# Patient Record
Sex: Female | Born: 1970 | Race: White | Hispanic: No
Health system: Southern US, Community
[De-identification: ages and names within clinical notes are randomized; demographics above are authoritative.]

---

## 1999-11-19 ENCOUNTER — Emergency Department (HOSPITAL_COMMUNITY): Admission: EM | Admit: 1999-11-19 | Discharge: 1999-11-19 | Payer: Self-pay | Admitting: *Deleted

## 2000-07-21 ENCOUNTER — Encounter: Payer: Self-pay | Admitting: Emergency Medicine

## 2000-07-21 ENCOUNTER — Emergency Department (HOSPITAL_COMMUNITY): Admission: EM | Admit: 2000-07-21 | Discharge: 2000-07-21 | Payer: Self-pay | Admitting: Emergency Medicine

## 2002-07-30 ENCOUNTER — Ambulatory Visit (HOSPITAL_BASED_OUTPATIENT_CLINIC_OR_DEPARTMENT_OTHER): Admission: RE | Admit: 2002-07-30 | Discharge: 2002-07-31 | Payer: Self-pay | Admitting: Otolaryngology

## 2004-01-23 ENCOUNTER — Ambulatory Visit (HOSPITAL_BASED_OUTPATIENT_CLINIC_OR_DEPARTMENT_OTHER): Admission: RE | Admit: 2004-01-23 | Discharge: 2004-01-23 | Payer: Self-pay | Admitting: Otolaryngology

## 2010-07-31 ENCOUNTER — Ambulatory Visit (HOSPITAL_COMMUNITY): Admission: RE | Admit: 2010-07-31 | Discharge: 2010-08-01 | Payer: Self-pay | Admitting: Neurosurgery

## 2010-12-04 LAB — URINE MICROSCOPIC-ADD ON

## 2010-12-04 LAB — BASIC METABOLIC PANEL
BUN: 10 mg/dL (ref 6–23)
CO2: 25 mEq/L (ref 19–32)
Calcium: 9.3 mg/dL (ref 8.4–10.5)
Chloride: 111 mEq/L (ref 96–112)
Creatinine, Ser: 0.7 mg/dL (ref 0.4–1.2)
GFR calc Af Amer: >60 mL/min (ref >60)
GFR calc non Af Amer: >60 mL/min (ref >60)
Glucose, Bld: 87 mg/dL (ref 70–99)
Potassium: 4.8 mEq/L (ref 3.5–5.1)
Sodium: 140 mEq/L (ref 135–145)

## 2010-12-04 LAB — URINALYSIS, ROUTINE W REFLEX MICROSCOPIC
Bilirubin Urine: NEGATIVE
Glucose, UA: NEGATIVE mg/dL
Hgb urine dipstick: NEGATIVE
Ketones, ur: NEGATIVE mg/dL
Nitrite: NEGATIVE
Protein, ur: NEGATIVE mg/dL
Specific Gravity, Urine: 1.02 (ref 1.005–1.030)
Urobilinogen, UA: 1 mg/dL (ref 0.0–1.0)
pH: 7.5 (ref 5.0–8.0)

## 2010-12-04 LAB — PROTIME-INR
INR: 1 (ref 0.00–1.49)
INR: 1.06 (ref 0.00–1.49)
Prothrombin Time: 13.4 seconds (ref 11.6–15.2)
Prothrombin Time: 14 seconds (ref 11.6–15.2)

## 2010-12-04 LAB — CBC
HCT: 37.9 % (ref 36.0–46.0)
Hemoglobin: 12.7 g/dL (ref 12.0–15.0)
MCH: 31.6 pg (ref 26.0–34.0)
MCHC: 33.5 g/dL (ref 30.0–36.0)
MCV: 94.3 fL (ref 78.0–100.0)
Platelets: 279 10*3/uL (ref 150–400)
RBC: 4.02 MIL/uL (ref 3.87–5.11)
RDW: 12.1 % (ref 11.5–15.5)
WBC: 8.5 10*3/uL (ref 4.0–10.5)

## 2010-12-04 LAB — APTT: aPTT: 27 s (ref 24–37)

## 2010-12-04 LAB — SURGICAL PCR SCREEN
MRSA, PCR: NEGATIVE
Staphylococcus aureus: NEGATIVE

## 2011-02-08 NOTE — Op Note (Signed)
NAME:  Monica Oneal, Monica Oneal                          ACCOUNT NO.:  [id number]   MEDICAL RECORD NO.:  [id number]                   PATIENT TYPE:  AMB   LOCATION:  DSC                                  FACILITY:  [facility]   PHYSICIAN:  Jefry H. Pollyann Kennedy, M.D.                DATE OF BIRTH:  1970/10/23   DATE OF PROCEDURE:  01/23/2004  DATE OF DISCHARGE:                                 OPERATIVE REPORT   PREOPERATIVE DIAGNOSIS:  Conductive hearing loss with ossicular  discontinuity.   POSTOPERATIVE DIAGNOSIS:  Conductive hearing loss with ossicular  discontinuity.   PROCEDURE:  Left ossiculoplasty with TORP.   SURGEON:  Jefry H. Pollyann Kennedy, M.D.   ANESTHESIA:  General endotracheal.   COMPLICATIONS:  None.   FINDINGS:  Modified Lippy bucket-handle prosthesis in place over the long  process of the incus with what appears to be further erosion of the long  process.  The malleus and the incus head were in good, normal position with  normal mobility. The graft was still in place in the oval window without any  evidence of leak.   INDICATIONS FOR PROCEDURE:  This is a 40 year old lady who underwent  stapedectomy about 12 or 13 years ago. A couple of years ago, she started  having problems and underwent a successful revision stapedectomy where, at  the time, it was found that the lenticular process of the incus was eroded  away from the previous prosthesis. She did well for about 1-1/2 years, then  started having distorted sound and discomfort, and this was very troublesome  to her.  She has requested removal of the prosthesis with whatever it takes  to make sure this does not happen again.   We discussed preoperatively that we could try to do a TORP and bypass the  incus all together, which should help with the distortion problem, but may  not give her as good a hearing results.  She understood all of this and  agreed to surgery.   DESCRIPTION OF PROCEDURE:  The patient was taken to the operating  room,  placed in the  supine position on the operating table.  Following induction  of general endotracheal anesthesia, the left ear was prepped and draped in a  standard fashion. Then 1% Xylocaine with epinephrine was infiltrated into 4  quadrants of the external auditory canal.  A posterior tympanomeatal flap  was developed using a Rosen round knife and was brought forward. The middle  ear was entered.  The flap was brought forward all the way to the lateral  process of the malleus.   The old prosthesis was identified and was easily removed off of the incus  long process.  It would not pull out of the soft tissue graft in the oval  window without removing the graft, and a new graft was placed into the oval  window immediately.  The old prosthesis and  graft was removed.  The  remainder of the incus long process was removed partially with malleus  nippers to improve visualization and to allow additional space for a new  prosthesis.   A Goldenberg TORP 8 mm with a notched head was trimmed back 1 mm to help  with fitting and was inserted into position.  The shaft was well-seated  within the graft, and the head and notch were stabilized by the malleus and  the drum. There was a good, round window reflex with movement of the  malleus. The anterior tympanic cavity was  packed with saline soaked Gelfoam.  The flap was brought back to its normal  position and the ear canal along the incision was packed with Ciprodex-  soaked Gelfoam.  A cotton ball was placed in the meatus.  The patient was  then awakened, extubated and transferred to recovery in stable condition.                                               Jefry H. Pollyann Kennedy, M.D.    JHR/MEDQ  D:  01/23/2004  T:  01/23/2004  Job:  [id number]

## 2011-02-08 NOTE — Op Note (Signed)
NAME:  Monica Oneal, CUEN                          ACCOUNT NO.:  [id number]   MEDICAL RECORD NO.:  [id number]                   PATIENT TYPE:  AMB   LOCATION:  DSC                                  FACILITY:  [facility]   PHYSICIAN:  Jefry H. Pollyann Kennedy, M.D.                DATE OF BIRTH:  [birth year]   DATE OF PROCEDURE:  07/30/2002  DATE OF DISCHARGE:                                 OPERATIVE REPORT   PREOPERATIVE DIAGNOSIS:  Conductive hearing loss.   POSTOPERATIVE DIAGNOSIS:  Conductive hearing loss.   OPERATION/PROCEDURE:  Exploratory tympanotomy, AS, with revision  stapedectomy.   SURGEON:  Jefry H. Pollyann Kennedy, M.D.   ANESTHESIA:  General endotracheal anesthesia.   COMPLICATIONS:  None.   ESTIMATED BLOOD LOSS:  Minimal.   FINDINGS:  A previously placed bucket handle stapes prosthesis, 4 mm in  length, almost completely disarticulated from the incus long process with  near complete degeneration and loss of the lenticular process.  The oval  window had a soft membranous covering without any regrowth of bone.  The  patient tolerated the procedure well and was awakened, extubated and  transferred to the recovery room in stable condition.   HISTORY:  This is a 40 year old female who underwent a left stapedectomy  approximately 11 years ago by another physician and was doing well until  several months ago when she started having distorted sound and intermittent  hearing loss of the left ear.  Examination in the office and audiometric  studies revealed conductive hearing loss with some mild fluctuation.  Risks,  benefits, alternatives, complications to the procedure were explained to the  patient who seemed to understand and agreed to the surgery.   DESCRIPTION OF PROCEDURE:  The patient was taken to the operating room and  placed on the operating table in the supine position.  Following induction  of general endotracheal anesthesia, the left ear was prepped and draped in  the standard  fashion.  One percent Xylocaine with epinephrine was  infiltrated into four quadrants of the external auditory canal.  A posterior  superior tympanomeatal flap was elevated using a round knife and the middle  ear was opened.  There was minimal mucosal banding and fibrosis identified.  The craniotympanic nerve was not identified.  The ossicular chain was  examined.  There was good mobility with the malleus and the incus but there  was minimal connectivity to the previous prosthesis and there was minimal  round window reflex.  The prosthesis was manipulated and was felt to be of  inadequate length given the changes in the incus lenticular process.  This  prosthesis was removed.  It was easily pulled from the oval window niche.  A  new perichondral graft was then placed within the oval window niche.  This  had been previously harvested from the tragus.  Tragal defect was then  closed with 5-0 plain gut suture.  A new Lippy modified Robinson bucket-  handle prosthesis was then placed into position.  This prosthesis was 4.5 mm  in length.  There was a nice snug fit and I was able to get the bucket-  handle over the remaining long process and to get good positioning and  stability.  There was a nice round window reflex with manipulation of the  malleus.  The eustachian tube orifice was packed with saline soaked Gelfoam.  Saline-soaked Gelfoam was then used to sandwich the tympanomeatal flap deep  and lateral to the flap which was then  placed back to its normal position.  Cortisporin-soaked Gelfoam was then  placed in the external auditory canal.  A cottonball was placed at the  external meatus.  The patient was awakened from anesthesia, transferred to  recovery in stable condition.                                               Jefry H. Pollyann Kennedy, M.D.    JHR/MEDQ  D:  07/30/2002  T:  07/31/2002  Job:  [id number]

## 2020-09-12 ENCOUNTER — Other Ambulatory Visit: Payer: Self-pay

## 2020-09-12 ENCOUNTER — Emergency Department (HOSPITAL_COMMUNITY): Payer: BC Managed Care – PPO

## 2020-09-12 ENCOUNTER — Emergency Department (HOSPITAL_COMMUNITY)
Admission: EM | Admit: 2020-09-12 | Discharge: 2020-09-13 | Disposition: A | Discharge status: Home / Self Care | Payer: BC Managed Care – PPO | Attending: Emergency Medicine | Admitting: Emergency Medicine

## 2020-09-12 ENCOUNTER — Encounter (HOSPITAL_COMMUNITY): Payer: Self-pay | Admitting: Emergency Medicine

## 2020-09-12 DIAGNOSIS — I10 Essential (primary) hypertension: Secondary | ICD-10-CM | POA: Diagnosis not present

## 2020-09-12 DIAGNOSIS — H9202 Otalgia, left ear: Secondary | ICD-10-CM | POA: Insufficient documentation

## 2020-09-12 LAB — CBC WITH DIFFERENTIAL/PLATELET
Abs Immature Granulocytes: 0.02 10*3/uL (ref 0.00–0.07)
Basophils Absolute: 0 10*3/uL (ref 0.0–0.1)
Basophils Relative: 0 %
Eosinophils Absolute: 0.2 10*3/uL (ref 0.0–0.5)
Eosinophils Relative: 2 %
HCT: 40.6 % (ref 36.0–46.0)
Hemoglobin: 13.7 g/dL (ref 12.0–15.0)
Immature Granulocytes: 0 %
Lymphocytes Relative: 29 %
Lymphs Abs: 2.8 10*3/uL (ref 0.7–4.0)
MCH: 31.4 pg (ref 26.0–34.0)
MCHC: 33.7 g/dL (ref 30.0–36.0)
MCV: 92.9 fL (ref 80.0–100.0)
Monocytes Absolute: 0.6 10*3/uL (ref 0.1–1.0)
Monocytes Relative: 6 %
Neutro Abs: 6.1 10*3/uL (ref 1.7–7.7)
Neutrophils Relative %: 63 %
Platelets: 342 10*3/uL (ref 150–400)
RBC: 4.37 MIL/uL (ref 3.87–5.11)
RDW: 13 % (ref 11.5–15.5)
WBC: 9.8 10*3/uL (ref 4.0–10.5)
nRBC: 0 % (ref 0.0–0.2)

## 2020-09-12 LAB — BASIC METABOLIC PANEL
Anion gap: 11 (ref 5–15)
BUN: 17 mg/dL (ref 6–20)
CO2: 23 mmol/L (ref 22–32)
Calcium: 9.4 mg/dL (ref 8.9–10.3)
Chloride: 106 mmol/L (ref 98–111)
Creatinine, Ser: 1.08 mg/dL — ABNORMAL HIGH (ref 0.44–1.00)
GFR, Estimated: >60 mL/min (ref >60)
Glucose, Bld: 117 mg/dL — ABNORMAL HIGH (ref 70–99)
Potassium: 3.8 mmol/L (ref 3.5–5.1)
Sodium: 140 mmol/L (ref 135–145)

## 2020-09-12 MED ORDER — LORAZEPAM 2 MG/ML IJ SOLN
0.5000 mg | Freq: Once | INTRAMUSCULAR | Status: AC
  Administered 2020-09-12: 0.5 mg via INTRAVENOUS
  Filled 2020-09-12: qty 1

## 2020-09-12 MED ORDER — IOHEXOL 300 MG/ML  SOLN
75.0000 mL | Freq: Once | INTRAMUSCULAR | Status: AC | PRN
  Administered 2020-09-12: 75 mL via INTRAVENOUS

## 2020-09-12 MED ORDER — KETOROLAC TROMETHAMINE 15 MG/ML IJ SOLN
15.0000 mg | Freq: Once | INTRAMUSCULAR | Status: AC
  Administered 2020-09-12: 15 mg via INTRAVENOUS
  Filled 2020-09-12: qty 1

## 2020-09-12 NOTE — ED Provider Notes (Addendum)
[facility]-EMERGENCY DEPT Provider Note   CSN: [id number] Arrival date & time: 09/12/20  2044     History Chief Complaint  Patient presents with  . Hypertension  . Ear Pain    Monica Oneal is a 49 y.o. female.  49 year old female with prior medical history as detailed below presents for evaluation of left ear pain. Patient reports longstanding chronic loss of hearing in the left ear. She is known to ENT at [facility] for this. She is status post both multiple surgical interventions with ENT at [facility]. Patient complains of pain and drainage from the left ear. This is been ongoing for the last 2 to 3 weeks. She reports multiple rounds of antibiotics for this complaint. She has not yet contacted [facility] ENT for evaluation.  The history is provided by the patient and medical records.  Hypertension This is a new problem. The problem occurs constantly. The problem has not changed since onset.Pertinent negatives include no chest pain, no abdominal pain, no headaches and no shortness of breath. Nothing aggravates the symptoms. Nothing relieves the symptoms.       History reviewed. No pertinent past medical history.  There are no problems to display for this patient.     OB History   No obstetric history on file.     History reviewed. No pertinent family history.     Home Medications Prior to Admission medications   Medication Sig Start Date End Date Taking? Authorizing Provider  acetaminophen (TYLENOL) 325 MG tablet Take 650 mg by mouth every 6 (six) hours as needed for mild pain, headache or fever.   Yes [provider]  amphetamine-dextroamphetamine (ADDERALL) 20 MG tablet Take 20 mg by mouth daily. 09/10/20  Yes [provider]  Ascorbic Acid (VITAMIN C PO) Take 1 tablet by mouth daily.   Yes [provider]  Cholecalciferol (D3 PO) Take 1 capsule by mouth daily.   Yes [provider]  diazepam (VALIUM) 10 MG tablet Take 10 mg by  mouth daily as needed for anxiety. 08/21/20  Yes [provider]  fluticasone (FLONASE) 50 MCG/ACT nasal spray Place 1-2 sprays into both nostrils daily as needed for allergies or rhinitis.   Yes [provider]  guaiFENesin (ROBITUSSIN) 100 MG/5ML liquid Take 200 mg by mouth 3 (three) times daily as needed for cough.   Yes [provider]  Multiple Vitamins-Minerals (ZINC PO) Take 1 tablet by mouth daily.   Yes [provider]  ofloxacin (FLOXIN) 0.3 % OTIC solution Place 3 drops into both ears 3 (three) times daily as needed for ear pain. 09/11/20  Yes [provider]  oxyCODONE (ROXICODONE) 15 MG immediate release tablet Take 15 mg by mouth 4 (four) times daily as needed for pain. 08/21/20  Yes [provider]  promethazine (PHENERGAN) 25 MG tablet Take 25 mg by mouth every 6 (six) hours as needed for nausea/vomiting. 08/22/20  Yes [provider]  RABEprazole (ACIPHEX) 20 MG tablet Take 20 mg by mouth daily.   Yes [provider]  azithromycin (ZITHROMAX) 250 MG tablet Take 250-500 mg by mouth as directed. 500mg  on day 1 250mg  on day 2-5 09/06/20   [provider]    Allergies    Penicillins  Review of Systems   Review of Systems  Respiratory: Negative for shortness of breath.   Cardiovascular: Negative for chest pain.  Gastrointestinal: Negative for abdominal pain.  Neurological: Negative for headaches.  All other systems reviewed and are  negative.   Physical Exam Updated Vital Signs BP (!) 146/88   Pulse 88   Temp 98.4 F (36.9 C) (Oral)   Resp 16   Ht 5\' 2"  (1.575 m)   Wt 88 kg   SpO2 95%   BMI 35.48 kg/m   Physical Exam Vitals and nursing note reviewed.  Constitutional:      General: She is not in acute distress.    Appearance: Normal appearance. She is well-developed and well-nourished.  HENT:     Head: Normocephalic and atraumatic.     Right Ear: Tympanic membrane, ear canal and  external ear normal.     Left Ear: Tympanic membrane and ear canal normal.     Ears:     Comments: Mild erythema and irritation of the left ear overlying the left tragus.  External canal is without significant erythema.    Nose: Nose normal.     Mouth/Throat:     Mouth: Oropharynx is clear and moist.  Eyes:     Extraocular Movements: EOM normal.     Conjunctiva/sclera: Conjunctivae normal.     Pupils: Pupils are equal, round, and reactive to light.  Cardiovascular:     Rate and Rhythm: Normal rate and regular rhythm.     Heart sounds: Normal heart sounds.  Pulmonary:     Effort: Pulmonary effort is normal. No respiratory distress.     Breath sounds: Normal breath sounds.  Abdominal:     General: There is no distension.     Palpations: Abdomen is soft.     Tenderness: There is no abdominal tenderness.  Musculoskeletal:        General: No deformity or edema. Normal range of motion.     Cervical back: Normal range of motion and neck supple.  Skin:    General: Skin is warm and dry.  Neurological:     Mental Status: She is alert and oriented to person, place, and time.  Psychiatric:        Mood and Affect: Mood and affect normal.     ED Results / Procedures / Treatments   Labs (all labs ordered are listed, but only abnormal results are displayed) Labs Reviewed  BASIC METABOLIC PANEL - Abnormal; Notable for the following components:      Result Value   Glucose, Bld 117 (*)    Creatinine, Ser 1.08 (*)    All other components within normal limits  CBC WITH DIFFERENTIAL/PLATELET    EKG None  Radiology No results found.  Procedures Procedures (including critical care time)  Medications Ordered in ED Medications  LORazepam (ATIVAN) injection 0.5 mg (0.5 mg Intravenous Given 09/12/20 2201)  ketorolac (TORADOL) 15 MG/ML injection 15 mg (15 mg Intravenous Given 09/12/20 2210)  iohexol (OMNIPAQUE) 300 MG/ML solution 75 mL (75 mLs Intravenous Contrast Given 09/12/20 2320)     ED Course  I have reviewed the triage vital signs and the nursing notes.  Pertinent labs & imaging results that were available during my care of the patient were reviewed by me and considered in my medical decision making (see chart for details).    MDM Rules/Calculators/A&P                          MDM  Screen complete  CRYSTALE GIANNATTASIO was evaluated in Emergency Department on 09/12/2020 for the symptoms described in the history of present illness. She was evaluated in the context of the global COVID-19 pandemic, which necessitated  consideration that the patient might be at risk for infection with the SARS-CoV-2 virus that causes COVID-19. Institutional protocols and algorithms that pertain to the evaluation of patients at risk for COVID-19 are in a state of rapid change based on information released by regulatory bodies including the CDC and federal and state organizations. These policies and algorithms were followed during the patient's care in the ED.   Patient is presenting with complaint of left ear pain and drainage. History is significant for numerous ear procedures and a presumptive diagnosis of otosclerosis. She has recently had multiple TORP prosthesis placed 3 -- all unsuccessfully. She continues to have a significant conductive hearing loss on her left side. She is followed by [facility]NT for this.  Her reported left ear pain is worse over last two weeks. She reports completion of multiple rounds of antibiotics (zpack) and use of otic drops.   She reports UC evaluation earlier today with advise given obtain CT imaging to rule out mastoiditis.   Exam is suggestive of perhaps mild eczema overlying the left tragus.   Screening labs are without significant abnormality.   Patient is appropriate for discharge.  Patient understands need for close follow-up with [facility] ENT tomorrow.  Patient will apply hydrocortisone cream over the left tragus for control of possible eczema.     Final  Clinical Impression(s) / ED Diagnoses Final diagnoses:  Ear pain, left    Rx / DC Orders ED Discharge Orders    None       Wynetta Fines, MD 09/13/20 0013    Wynetta Fines, MD 09/13/20 [phone]

## 2020-09-12 NOTE — ED Triage Notes (Signed)
Pt reports high blood pressure and pain in her L ear. Also reports that she feels swollen. A&Ox4. Ambulatory.

## 2020-09-13 NOTE — Discharge Instructions (Addendum)
Please return for any problem.  Contact [facility] ENT tomorrow as instructed.   Apply hydrocortisone cream to skin overlying the anterior left ear as instructed.

## 2021-08-20 IMAGING — CT CT TEMPORAL BONES W/ CM
2 of 6 series · 8 of 40 positions shown, 10 images · IV contrast (omnipaque)
Comparison: None available.

CLINICAL DATA: Initial evaluation for mastoiditis. Left-sided
sensorineural hearing loss, history of prior left stapedectomy.

EXAM:
CT TEMPORAL BONES WITH CONTRAST
TECHNIQUE: Axial and coronal plane CT imaging of the petrous temporal bones was
performed with thin-collimation image reconstruction after
intravenous contrast administration. Multiplanar CT image
reconstructions were also generated.
CONTRAST:  75mL OMNIPAQUE IOHEXOL 300 MG/ML  SOLN

[Series 8: cor mag right · coronal · 0.19mm/px · 2 of 326 slices shown]
[im 109/326  bone]
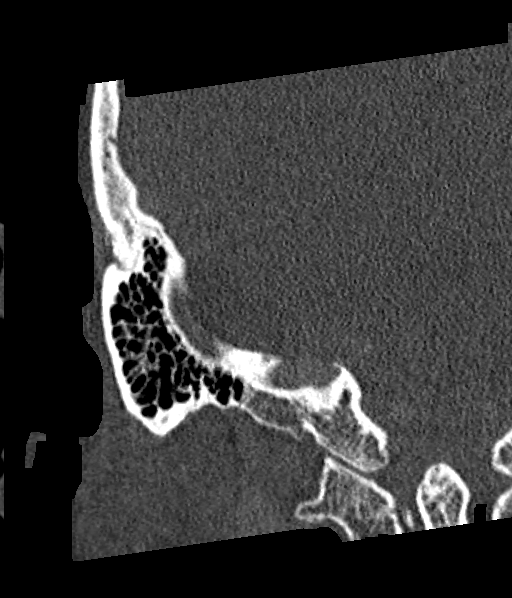
[im 217/326  bone]
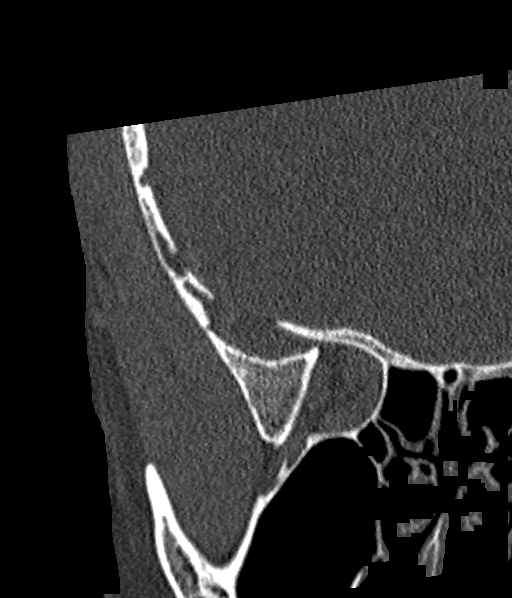

[Series 9: ax mag left · axial · 0.19mm/px · z∈[-63,+27]mm · 6 of 209 slices shown, 8 images]
[im 33/209  brain]
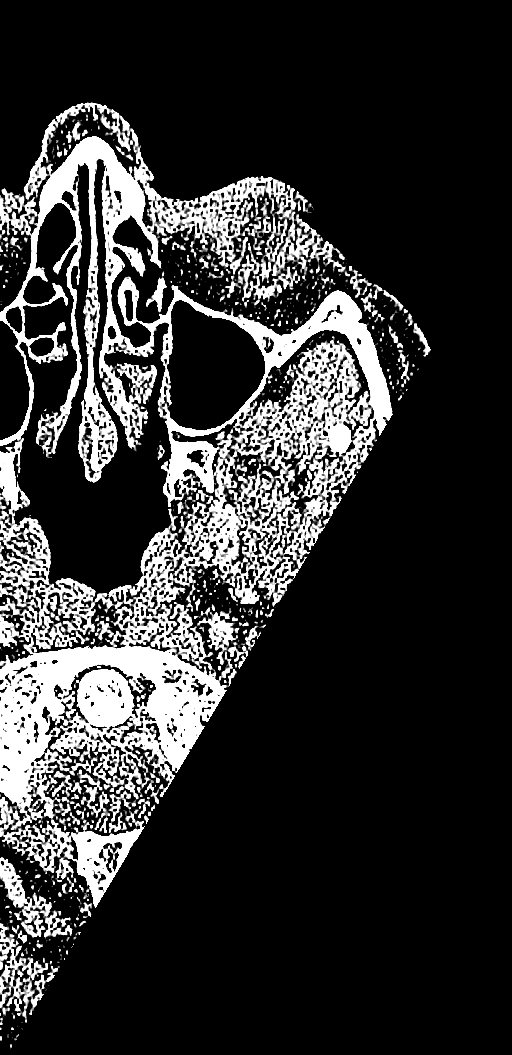
[im 33/209  bone]
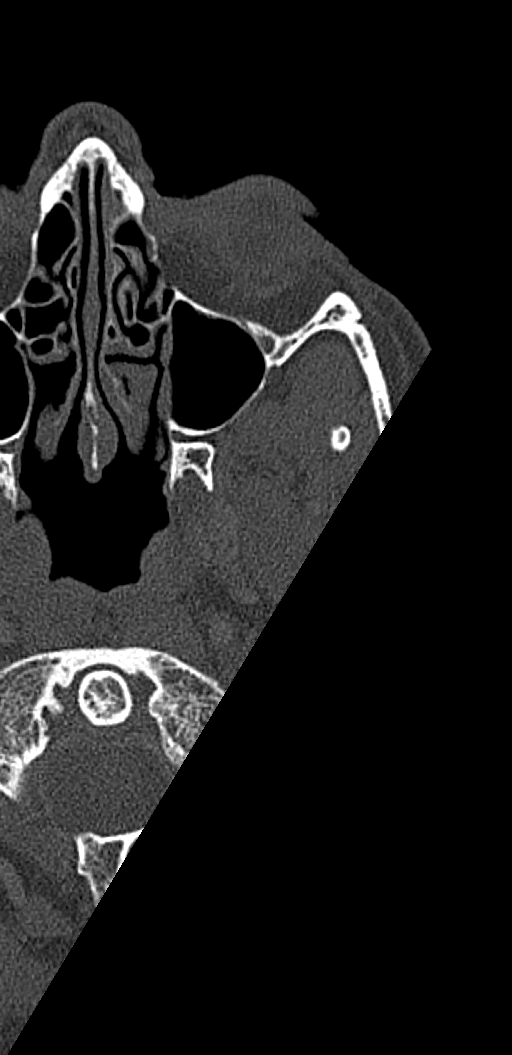
[im 65/209  bone]
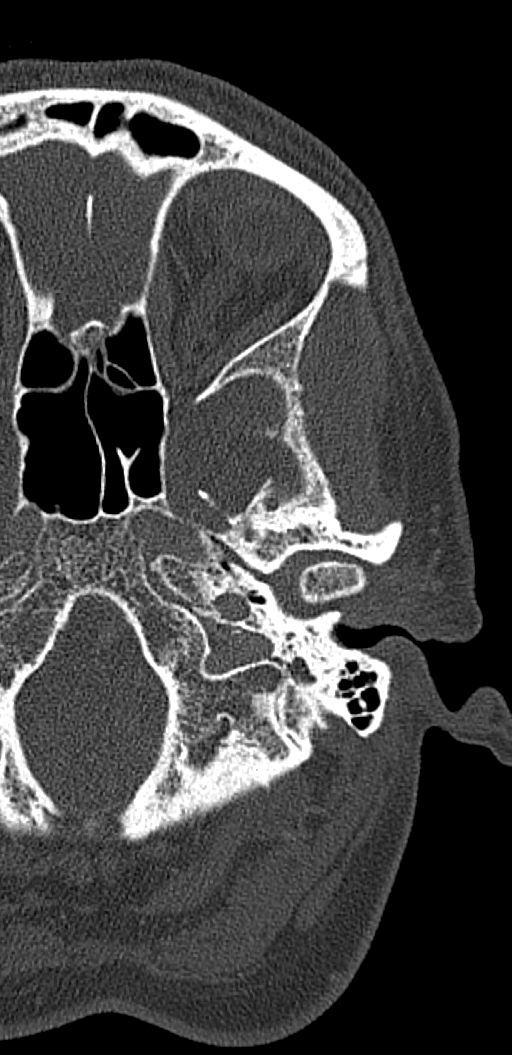
[im 97/209  bone]
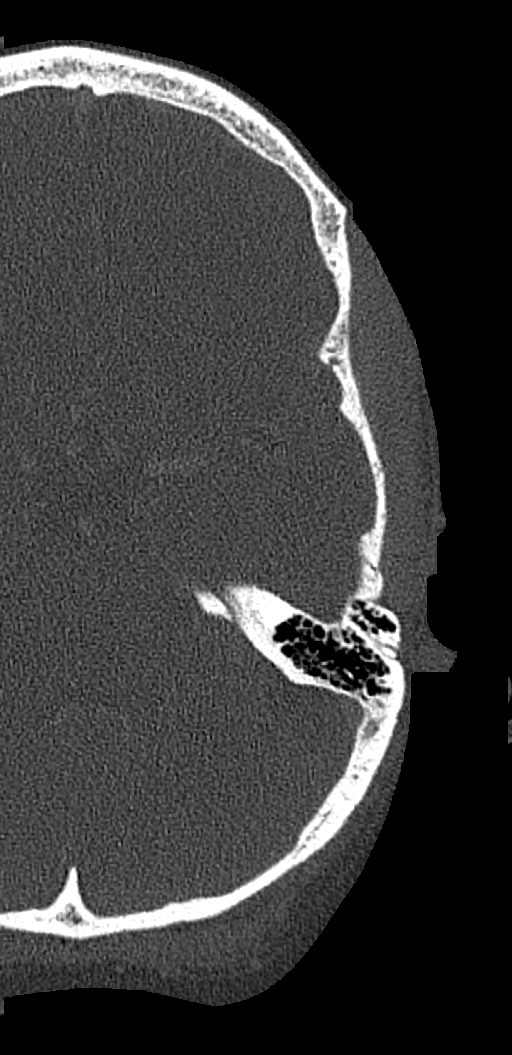
[im 129/209  bone]
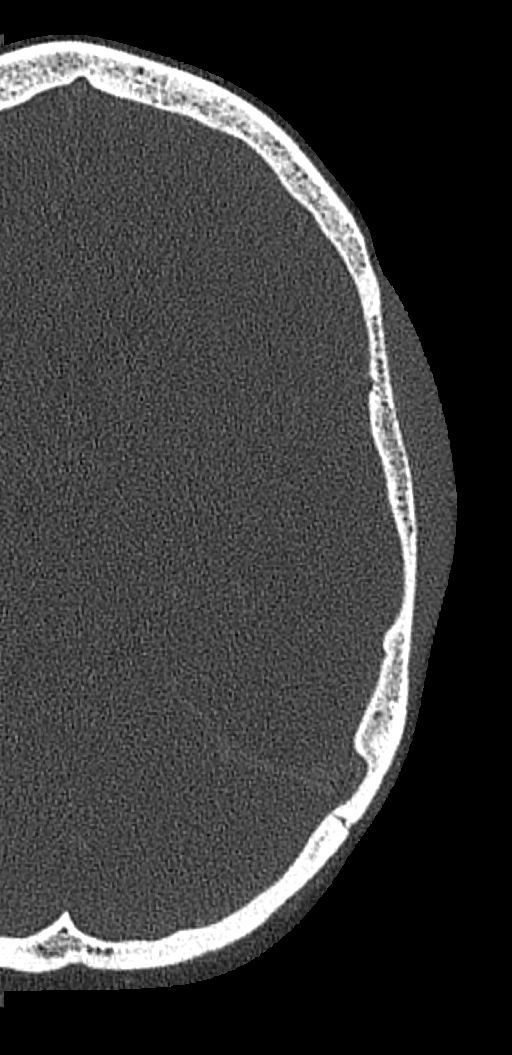
[im 161/209  brain]
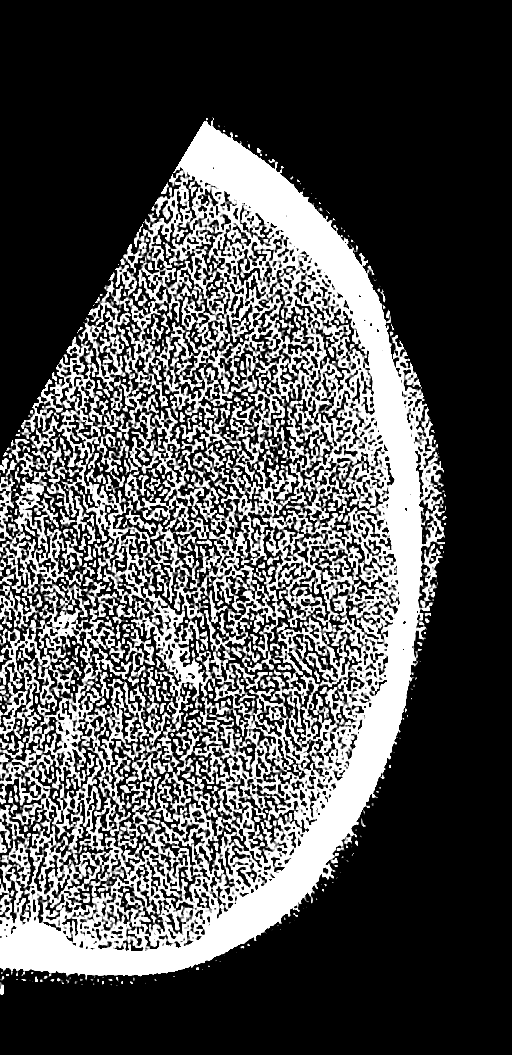
[im 161/209  bone]
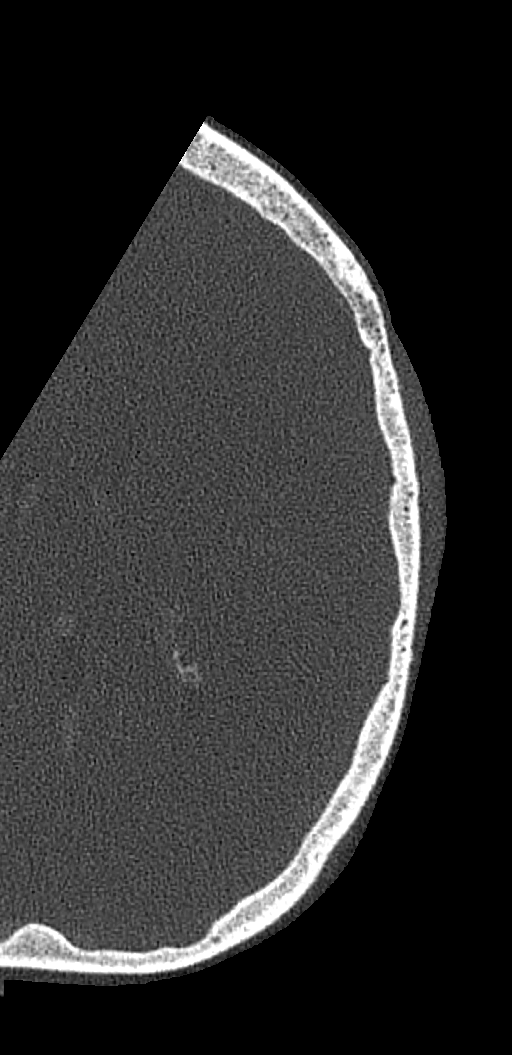
[im 193/209  bone]
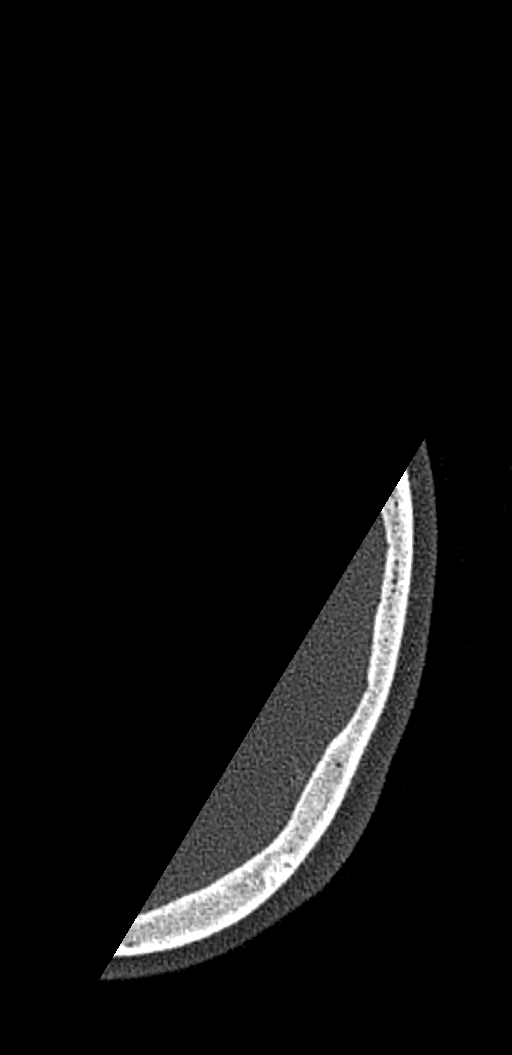

[8 of 40 positions shown; findings below may reference images not displayed]

FINDINGS: RIGHT TEMPORAL BONE:

The auricle, tragus, and pinna are within normal limits. Pre and
postauricular soft tissues normal. Thin linear soft tissue band
traversing the right EAC likely reflects cerumen. Tympanic membrane
is thin. Middle ear cavity is clear. Tegmen tympani intact. Mastoid
air cells are clear. Sigmoid plate intact. Normal opacification of
the adjacent sigmoid sinus and jugular bulb. Ossicular chain intact
and normally formed. Internal auditory canal normal. Inner ear
structures including the vestibule, cochlea, and semi circular
canals are within normal limits. Facial nerve canal intact and bony
covered. No abnormality about the stylomastoid foramen. Carotid
canal intact and normal.

LEFT TEMPORAL BONE:

The auricle, tragus, and pinna are within normal limits. No
significant pre or postauricular soft tissue swelling or
inflammatory stranding. External auditory canal remains patent. Mild
heterogeneous thickening of the left tympanic membrane, suspected to
be postsurgical. Middle ear cavity is clear. Tegmen tympani intact.
Mastoid air cells are clear without evidence for acute mastoiditis.
Sigmoid plate intact. Normal opacification of the adjacent left
sigmoid sinus and jugular bulb.

Malleus to stapes prosthesis in place. Prosthesis appears normally
positioned, articulating with the malleus and oval window. Suspected
subtle changes of otosclerosis with fenestral lucency about the oval
window noted. Internal auditory canal within normal limits.
Vestibule, cochlea, and semi circular canals within normal limits.
Facial nerve canal intact and bony covered. No abnormality about the
stylomastoid foramen. Carotid canal intact and within normal limits.

Visualized intracranial contents within normal limits. Globes and
orbital soft tissues unremarkable. Mild mucoperiosteal thickening
noted within the ethmoidal air cells. Visualized paranasal sinuses
are otherwise clear. Visualized nasopharynx and oropharynx within
normal limits. Remainder of the visualized soft tissues of the face
demonstrate no significant finding.
IMPRESSION: 1. Postoperative changes from previous left-sided ossicular chain
reconstruction with left-sided stapes prosthesis in place.
Prosthesis appears normally position without complication or adverse
features.
2. Suspected subtle changes of left-sided otosclerosis with
fenestral lucency about the left oval window.
3. Mild thickening and irregularity of the left tympanic membrane,
likely postoperative in nature.
4. Otherwise normal temporal bone CT. No evidence for acute
otomastoiditis.
